# Patient Record
Sex: Male | Born: 1980 | Race: Black or African American | Hispanic: No | Marital: Married | Smoking: Never smoker
Health system: Southern US, Community
[De-identification: ages and names within clinical notes are randomized; demographics above are authoritative.]

---

## 2008-01-01 ENCOUNTER — Emergency Department (HOSPITAL_COMMUNITY): Admission: EM | Admit: 2008-01-01 | Discharge: 2008-01-01 | Payer: Self-pay | Admitting: Emergency Medicine

## 2008-03-03 ENCOUNTER — Emergency Department (HOSPITAL_COMMUNITY): Admission: EM | Admit: 2008-03-03 | Discharge: 2008-03-04 | Payer: Self-pay | Admitting: Emergency Medicine

## 2008-05-27 ENCOUNTER — Emergency Department (HOSPITAL_COMMUNITY): Admission: EM | Admit: 2008-05-27 | Discharge: 2008-05-27 | Payer: Self-pay | Admitting: Emergency Medicine

## 2009-07-23 ENCOUNTER — Emergency Department (HOSPITAL_COMMUNITY): Admission: EM | Admit: 2009-07-23 | Discharge: 2009-07-24 | Payer: Self-pay | Admitting: Emergency Medicine

## 2010-06-09 LAB — POCT I-STAT, CHEM 8
Calcium, Ion: 1.2 mmol/L (ref 1.12–1.32)
Chloride: 104 mEq/L (ref 96–112)
Hemoglobin: 15 g/dL (ref 13.0–17.0)
Potassium: 3.8 mEq/L (ref 3.5–5.1)
TCO2: 27 mmol/L (ref 0–100)

## 2010-06-09 LAB — CBC
HCT: 43.2 % (ref 39.0–52.0)
MCHC: 33.5 g/dL (ref 30.0–36.0)
Platelets: 203 10*3/uL (ref 150–400)
RBC: 5.29 MIL/uL (ref 4.22–5.81)
WBC: 4.9 10*3/uL (ref 4.0–10.5)

## 2010-06-09 LAB — DIFFERENTIAL
Eosinophils Absolute: 0.1 10*3/uL (ref 0.0–0.7)
Eosinophils Relative: 2 % (ref 0–5)
Monocytes Relative: 7 % (ref 3–12)
Neutro Abs: 2.1 10*3/uL (ref 1.7–7.7)
Neutrophils Relative %: 44 % (ref 43–77)

## 2010-06-09 LAB — POCT CARDIAC MARKERS
CKMB, poc: 1.2 ng/mL (ref 1.0–8.0)
Myoglobin, poc: 44.8 ng/mL (ref 12–200)
Myoglobin, poc: 60.8 ng/mL (ref 12–200)
Troponin i, poc: <0.05 ng/mL (ref 0.00–0.09)

## 2010-06-09 LAB — D-DIMER, QUANTITATIVE: D-Dimer, Quant: <0.22 ug/mL-FEU (ref 0.00–0.48)

## 2010-11-29 LAB — DIFFERENTIAL
Eosinophils Absolute: 0
Eosinophils Relative: 1
Lymphocytes Relative: 22
Lymphs Abs: 1.4
Monocytes Absolute: 0.5
Monocytes Relative: 7
Neutro Abs: 4.5

## 2010-11-29 LAB — URINALYSIS, ROUTINE W REFLEX MICROSCOPIC
Bilirubin Urine: NEGATIVE
Protein, ur: NEGATIVE
Specific Gravity, Urine: 1.019
pH: 6.5

## 2010-11-29 LAB — CBC
Hemoglobin: 14.9
RDW: 13.6

## 2010-11-29 LAB — HEPATIC FUNCTION PANEL
ALT: 14
AST: 20
Albumin: 3.9
Bilirubin, Direct: 0.2
Total Bilirubin: 0.6

## 2010-11-29 LAB — BASIC METABOLIC PANEL
BUN: 8
CO2: 27
Calcium: 9.4
Creatinine, Ser: 1.04

## 2011-10-30 IMAGING — CR DG ANKLE COMPLETE 3+V*R*
3 series · 3 of 3 positions shown · non-contrast
Comparison: None

CLINICAL DATA: Ankle sprain, swelling.

RIGHT ANKLE - COMPLETE 3+ VIEW

[t ankle joint ap right]
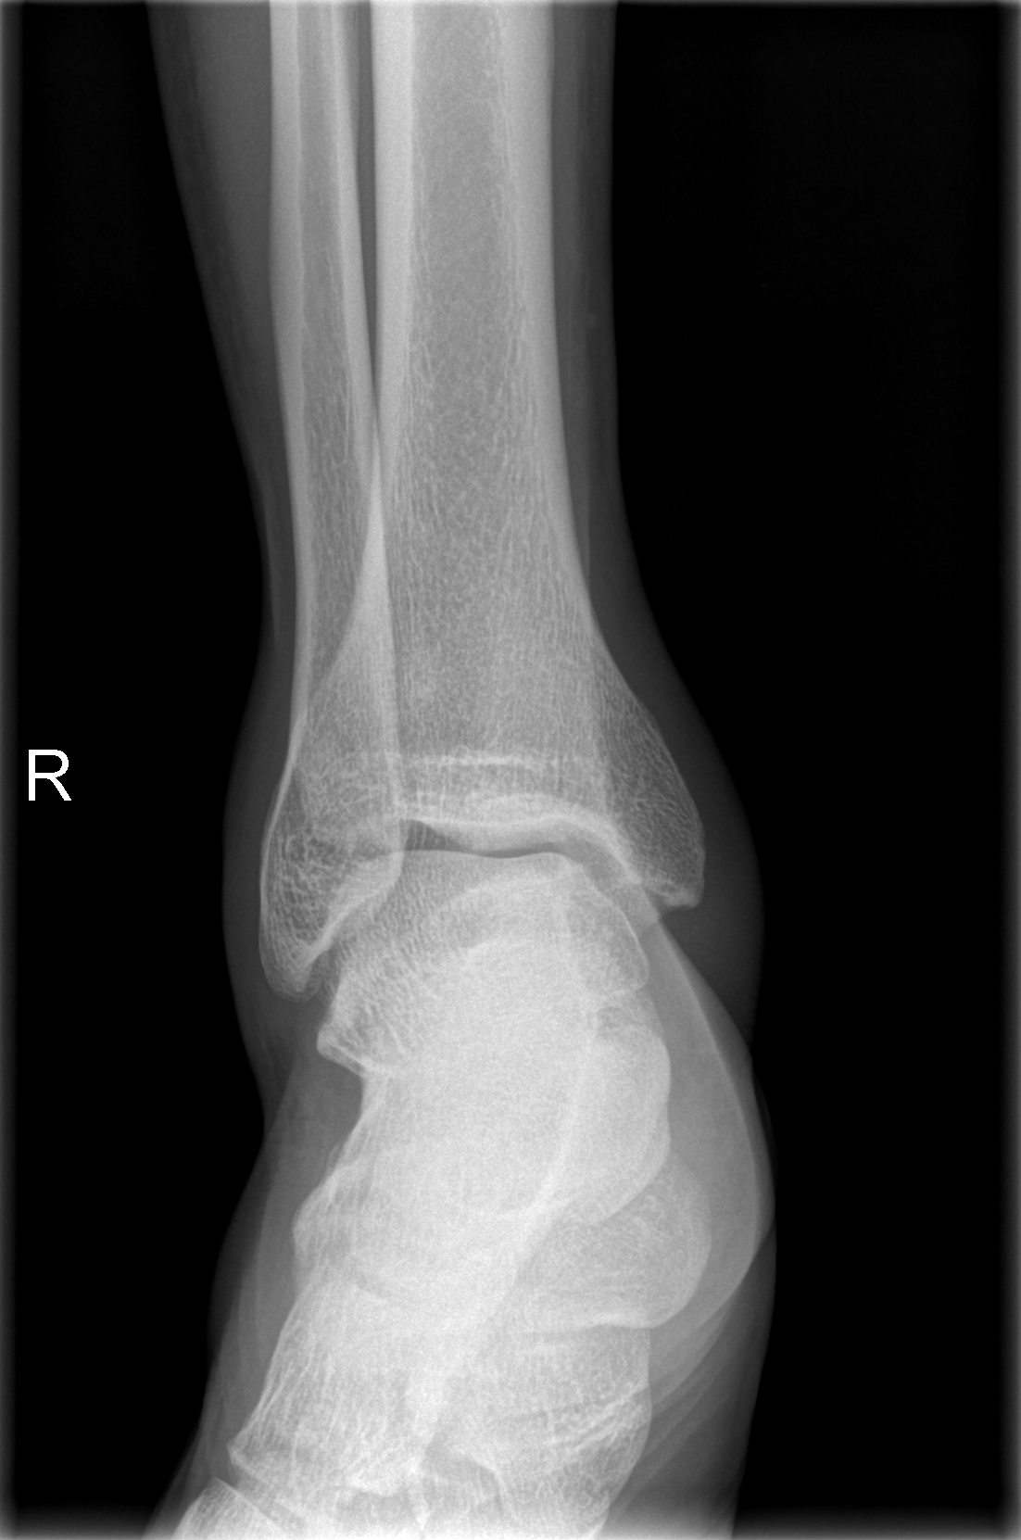

[t ankle joint oblique right]
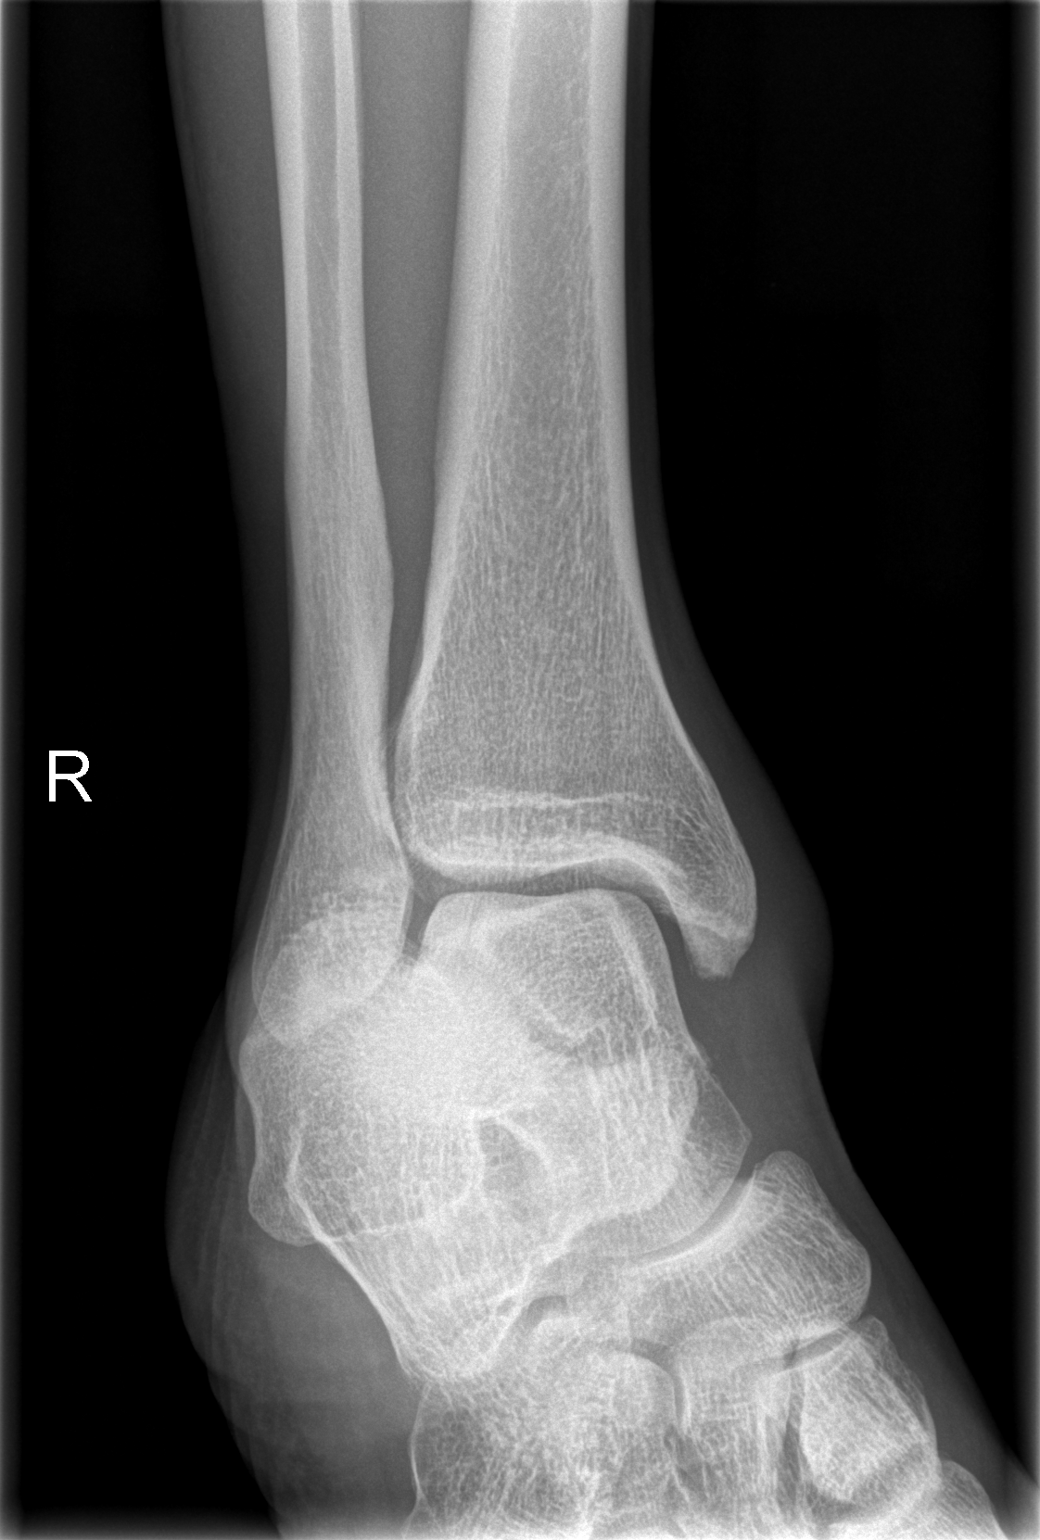

[t ankle joint lat right]
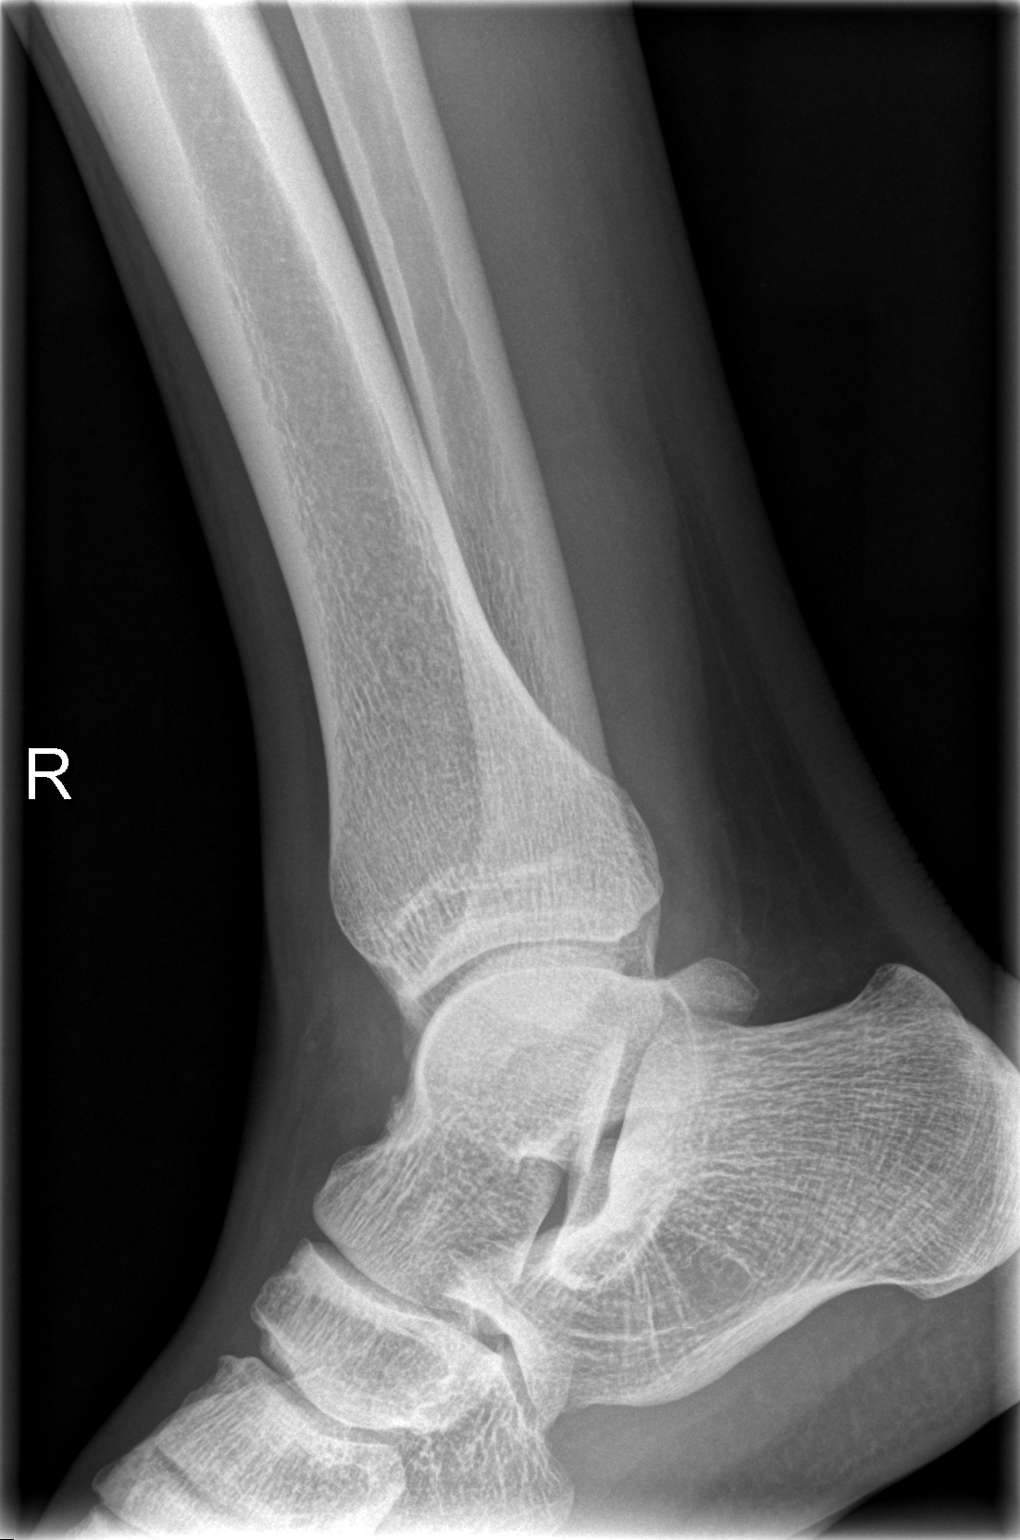

[3 of 3 positions shown; findings below may reference images not displayed]

FINDINGS: Mild diffuse soft tissue swelling. No acute bony
abnormality.  Specifically, no fracture, subluxation, or
dislocation.  Soft tissues are intact.
IMPRESSION: No acute bony abnormality.

## 2016-12-29 ENCOUNTER — Ambulatory Visit: Payer: Self-pay | Admitting: Nurse Practitioner

## 2017-02-28 ENCOUNTER — Ambulatory Visit (INDEPENDENT_AMBULATORY_CARE_PROVIDER_SITE_OTHER): Payer: BLUE CROSS/BLUE SHIELD | Admitting: Nurse Practitioner

## 2017-02-28 ENCOUNTER — Other Ambulatory Visit (INDEPENDENT_AMBULATORY_CARE_PROVIDER_SITE_OTHER): Payer: BLUE CROSS/BLUE SHIELD

## 2017-02-28 ENCOUNTER — Encounter: Payer: Self-pay | Admitting: Nurse Practitioner

## 2017-02-28 VITALS — BP 112/78 | HR 55 | Temp 97.8°F | Resp 16 | Ht 69.0 in | Wt 184.0 lb

## 2017-02-28 DIAGNOSIS — H6121 Impacted cerumen, right ear: Secondary | ICD-10-CM | POA: Diagnosis not present

## 2017-02-28 DIAGNOSIS — R4582 Worries: Secondary | ICD-10-CM | POA: Diagnosis not present

## 2017-02-28 DIAGNOSIS — Z0001 Encounter for general adult medical examination with abnormal findings: Secondary | ICD-10-CM | POA: Diagnosis not present

## 2017-02-28 DIAGNOSIS — M25562 Pain in left knee: Secondary | ICD-10-CM

## 2017-02-28 DIAGNOSIS — Z13220 Encounter for screening for lipoid disorders: Secondary | ICD-10-CM

## 2017-02-28 DIAGNOSIS — Z23 Encounter for immunization: Secondary | ICD-10-CM | POA: Diagnosis not present

## 2017-02-28 DIAGNOSIS — G8929 Other chronic pain: Secondary | ICD-10-CM | POA: Diagnosis not present

## 2017-02-28 LAB — CBC
HEMATOCRIT: 51.2 % (ref 39.0–52.0)
HEMOGLOBIN: 17.1 g/dL — AB (ref 13.0–17.0)
MCHC: 33.5 g/dL (ref 30.0–36.0)
MCV: 79.9 fl (ref 78.0–100.0)
Platelets: 214 10*3/uL (ref 150.0–400.0)
RBC: 6.4 Mil/uL — ABNORMAL HIGH (ref 4.22–5.81)
RDW: 13.7 % (ref 11.5–15.5)
WBC: 4 10*3/uL (ref 4.0–10.5)

## 2017-02-28 LAB — COMPREHENSIVE METABOLIC PANEL
ALT: 20 U/L (ref 0–53)
AST: 17 U/L (ref 0–37)
Albumin: 3.8 g/dL (ref 3.5–5.2)
Alkaline Phosphatase: 37 U/L — ABNORMAL LOW (ref 39–117)
BUN: 11 mg/dL (ref 6–23)
CHLORIDE: 105 meq/L (ref 96–112)
CO2: 31 meq/L (ref 19–32)
Calcium: 9.1 mg/dL (ref 8.4–10.5)
Creatinine, Ser: 1.12 mg/dL (ref 0.40–1.50)
GFR: 95.77 mL/min (ref >60.00)
GLUCOSE: 87 mg/dL (ref 70–99)
POTASSIUM: 4.1 meq/L (ref 3.5–5.1)
SODIUM: 143 meq/L (ref 135–145)
Total Bilirubin: 0.9 mg/dL (ref 0.2–1.2)
Total Protein: 5.9 g/dL — ABNORMAL LOW (ref 6.0–8.3)

## 2017-02-28 LAB — LIPID PANEL
Cholesterol: 196 mg/dL (ref 0–200)
HDL: 43.2 mg/dL (ref >39.00)
LDL Cholesterol: 130 mg/dL — ABNORMAL HIGH (ref 0–99)
NONHDL: 152.31
Total CHOL/HDL Ratio: 5
Triglycerides: 110 mg/dL (ref 0.0–149.0)
VLDL: 22 mg/dL (ref 0.0–40.0)

## 2017-02-28 LAB — TSH: TSH: 1.6 u[IU]/mL (ref 0.35–4.50)

## 2017-02-28 NOTE — Patient Instructions (Addendum)
Please head downstairs for lab work.  No Q-tips in your ears!!  Ill see you back in about 1 year for your annual physical, or sooner if you need me.  It was nice to meet you. Welcome to [facility]!   Preventive Care 18-39 Years, Male Preventive care refers to lifestyle choices and visits with your health care provider that can promote health and wellness. What does preventive care include?  A yearly physical exam. This is also called an annual well check.  Dental exams once or twice a year.  Routine eye exams. Ask your health care provider how often you should have your eyes checked.  Personal lifestyle choices, including: ? Daily care of your teeth and gums. ? Regular physical activity. ? Eating a healthy diet. ? Avoiding tobacco and drug use. ? Limiting alcohol use. ? Practicing safe sex. What happens during an annual well check? The services and screenings done by your health care provider during your annual well check will depend on your age, overall health, lifestyle risk factors, and family history of disease. Counseling Your health care provider may ask you questions about your:  Alcohol use.  Tobacco use.  Drug use.  Emotional well-being.  Home and relationship well-being.  Sexual activity.  Eating habits.  Work and work Statistician.  Screening You may have the following tests or measurements:  Height, weight, and BMI.  Blood pressure.  Lipid and cholesterol levels. These may be checked every 5 years starting at age 59.  Diabetes screening. This is done by checking your blood sugar (glucose) after you have not eaten for a while (fasting).  Skin check.  Hepatitis C blood test.  Hepatitis B blood test.  Sexually transmitted disease (STD) testing.  Discuss your test results, treatment options, and if necessary, the need for more tests with your health care provider. Vaccines Your health care provider may recommend certain vaccines, such  as:  Influenza vaccine. This is recommended every year.  Tetanus, diphtheria, and acellular pertussis (Tdap, Td) vaccine. You may need a Td booster every 10 years.  Varicella vaccine. You may need this if you have not been vaccinated.  HPV vaccine. If you are 71 or younger, you may need three doses over 6 months.  Measles, mumps, and rubella (MMR) vaccine. You may need at least one dose of MMR.You may also need a second dose.  Pneumococcal 13-valent conjugate (PCV13) vaccine. You may need this if you have certain conditions and have not been vaccinated.  Pneumococcal polysaccharide (PPSV23) vaccine. You may need one or two doses if you smoke cigarettes or if you have certain conditions.  Meningococcal vaccine. One dose is recommended if you are age 81-21 years and a first-year college student living in a residence hall, or if you have one of several medical conditions. You may also need additional booster doses.  Hepatitis A vaccine. You may need this if you have certain conditions or if you travel or work in places where you may be exposed to hepatitis A.  Hepatitis B vaccine. You may need this if you have certain conditions or if you travel or work in places where you may be exposed to hepatitis B.  Haemophilus influenzae type b (Hib) vaccine. You may need this if you have certain risk factors.  Talk to your health care provider about which screenings and vaccines you need and how often you need them. This information is not intended to replace advice given to you by your health care provider. Make sure  you discuss any questions you have with your health care provider. Document Released: 04/11/2001 Document Revised: 11/03/2015 Document Reviewed: 12/15/2014 Elsevier Interactive Patient Education  [employer].

## 2017-02-28 NOTE — Assessment & Plan Note (Addendum)
Immunizations reviewed and updated. Need for influenza vaccination - Flu Vaccine QUAD 36+ mos IM Need for Tdap vaccination - Tdap vaccine greater than or equal to 36yo IM  Healthy diet and exercise discussed including reduced red meat and 1/2 plate of veggies at meals, getting 150 minutes of exercise per week. Sunscreen and seatbelt discussed Routine dental cleanings discussed  Screening tests. Screening for cholesterol level - Lipid panel; Future Worries - TSH; Future - CBC; Future-pt request - Comprehensive metabolic panel; Future-pt request  RTC in 1 year for CPE, or sooner if needed. Pt education handout given.

## 2017-02-28 NOTE — Progress Notes (Signed)
Subjective:    Patient ID: Daryl Smith, male    DOB: May 12, 1980, 37 y.o.   MRN: [id number]  HPI Daryl Smith is a new patient who presents today to establish care. Patient presents today for complete physical.  Immunizations: Influenza: today TDAP: today Vision: annually Dental: no Diet: Breakfast- oatmeal, eggs; Lunch- ham sandwich; Dinner-cooks at home- grilled meat and vegetables Snack- trail mix, cereal bars; Drink- water, milk Exercise: Plays basketball, weight room- 3 days  week Sunscreen no; seatbelt yes Occupation/hazards: no worries  Review of Systems  Constitutional: Negative.  Negative for activity change and appetite change.  HENT: Negative for dental problem and voice change.   Eyes: Negative for visual disturbance.  Respiratory: Negative for cough and shortness of breath.   Cardiovascular: Negative for chest pain and palpitations.  Gastrointestinal: Negative for constipation and diarrhea.  Endocrine: Negative for cold intolerance and heat intolerance.  Genitourinary: Negative for difficulty urinating and hematuria.  Musculoskeletal: Positive for arthralgias. Negative for back pain and myalgias.  Skin: Negative for rash.  Allergic/Immunologic: Negative for environmental allergies and food allergies.  Neurological: Negative for speech difficulty and headaches.  Hematological: Does not bruise/bleed easily.  Psychiatric/Behavioral:       Negative for depression or anxiety.   L knee pain- this is a chronic problem. This is the first encounter for this problem. The pain began months ago. The pain occurs while active, such as playing basketball. The pain is relieved with compression sleeve, rest. He did not injure the knee. He denies weakness, falls, swelling, radiation, numbness, tingling.     History reviewed. No pertinent past medical history.   Social History   Socioeconomic History  . Marital status: Married    Spouse name: Not on file  . Number of  children: 2  . Years of education: Not on file  . Highest education level: Not on file  Social Needs  . Financial resource strain: Not on file  . Food insecurity - worry: Not on file  . Food insecurity - inability: Not on file  . Transportation needs - medical: No  . Transportation needs - non-medical: No  Occupational History  . Not on file  Tobacco Use  . Smoking status: Never Smoker  . Smokeless tobacco: Never Used  Substance and Sexual Activity  . Alcohol use: No    Frequency: Never  . Drug use: No  . Sexual activity: Yes    Partners: Female    Comment: male partner-wife  Other Topics Concern  . Not on file  Social History Narrative   Play basketball.   Spend time with wife and child- one baby on the way.   New puppy he is enjoying playing with.    History reviewed. No pertinent surgical history.  History reviewed. No pertinent family history.  No Known Allergies  No current outpatient medications on file prior to visit.   No current facility-administered medications on file prior to visit.     BP 112/78 (BP Location: Left Arm, Patient Position: Sitting, Cuff Size: Large)   Pulse (!) 55   Temp 97.8 F (36.6 C) (Other (Comment))   Resp 16   Ht 5\' 9"  (1.753 m)   Wt 184 lb (83.5 kg)   SpO2 96%   BMI 27.17 kg/m    Objective:   Physical Exam  General Appearance:    Alert, cooperative, no distress, appears stated age  Head:    Normocephalic, without obvious abnormality, atraumatic  Eyes:    PERRL, conjunctiva/corneas  clear, EOM's intact     Ears:    Normal TM and external ear canal, left ear; Cloudy TM and normal external ear canal, right ear  Nose:   Nares normal, septum midline, mucosa normal, no drainage   or sinus tenderness  Throat:   Lips, mucosa, and tongue normal; teeth and gums normal  Neck:   Supple, symmetrical, trachea midline, no adenopathy;       thyroid:  No enlargement/tenderness/nodules  Back:     Symmetric, no curvature, ROM normal, no  CVA tenderness  Lungs:     Clear to auscultation bilaterally, respirations unlabored  Chest wall:    No tenderness or deformity  Heart:    Regular rhythm, heart sounds normal, no murmur  Abdomen:     Soft, non-tender, bowel sounds active all four quadrants,    no masses, no organomegaly        Extremities:   Extremities normal, atraumatic, no cyanosis or edema  Pulses:   2+ and symmetric all extremities  Skin:   Skin color, texture, turgor normal, no rashes or lesions  Lymph nodes:   Cervical, supraclavicular nodes normal  Neurologic:   CNII-XII intact. Normal strength, sensation and reflexes      Throughout Psychiatric: Normal behavior, mood, judgement and thought content   Cloudy right    Assessment & Plan:   Impacted cerumen of right ear No related complaints. Cerumen impaction found on PE. He cleans ears with q-tips. Irrigation complete in office today with successful cerumen impaction removal. Full visualization of TM after irrigation.  Chronic pain of left knee Ongoing for some time, intermittent. The problem is stable. He does not request further testing or treatment. He will let me know if problem worsens.
